# Patient Record
Sex: Female | Born: 1965
Health system: Southern US, Community
[De-identification: ages and names within clinical notes are randomized; demographics above are authoritative.]

---

## 1998-03-20 ENCOUNTER — Other Ambulatory Visit: Admission: RE | Admit: 1998-03-20 | Discharge: 1998-03-20 | Payer: Self-pay | Admitting: Obstetrics & Gynecology

## 1999-04-01 ENCOUNTER — Other Ambulatory Visit: Admission: RE | Admit: 1999-04-01 | Discharge: 1999-04-01 | Payer: Self-pay | Admitting: Obstetrics & Gynecology

## 2000-05-10 ENCOUNTER — Other Ambulatory Visit: Admission: RE | Admit: 2000-05-10 | Discharge: 2000-05-10 | Payer: Self-pay | Admitting: Obstetrics & Gynecology

## 2001-05-27 ENCOUNTER — Other Ambulatory Visit: Admission: RE | Admit: 2001-05-27 | Discharge: 2001-05-27 | Payer: Self-pay | Admitting: Obstetrics & Gynecology

## 2001-06-13 ENCOUNTER — Ambulatory Visit (HOSPITAL_BASED_OUTPATIENT_CLINIC_OR_DEPARTMENT_OTHER): Admission: RE | Admit: 2001-06-13 | Discharge: 2001-06-13 | Payer: Self-pay | Admitting: General Surgery

## 2001-06-13 ENCOUNTER — Encounter (INDEPENDENT_AMBULATORY_CARE_PROVIDER_SITE_OTHER): Payer: Self-pay | Admitting: Specialist

## 2001-11-02 ENCOUNTER — Encounter: Admission: RE | Admit: 2001-11-02 | Discharge: 2002-01-31 | Payer: Self-pay | Admitting: Internal Medicine

## 2002-07-04 ENCOUNTER — Other Ambulatory Visit: Admission: RE | Admit: 2002-07-04 | Discharge: 2002-07-04 | Payer: Self-pay | Admitting: Obstetrics & Gynecology

## 2003-08-01 ENCOUNTER — Other Ambulatory Visit: Admission: RE | Admit: 2003-08-01 | Discharge: 2003-08-01 | Payer: Self-pay | Admitting: Obstetrics & Gynecology

## 2004-09-16 ENCOUNTER — Other Ambulatory Visit: Admission: RE | Admit: 2004-09-16 | Discharge: 2004-09-16 | Payer: Self-pay | Admitting: Obstetrics & Gynecology

## 2006-11-03 ENCOUNTER — Encounter: Admission: RE | Admit: 2006-11-03 | Discharge: 2006-11-03 | Payer: Self-pay | Admitting: Obstetrics & Gynecology

## 2010-10-19 ENCOUNTER — Emergency Department (HOSPITAL_COMMUNITY): Payer: BC Managed Care – PPO

## 2010-10-19 ENCOUNTER — Emergency Department (HOSPITAL_COMMUNITY)
Admission: EM | Admit: 2010-10-19 | Discharge: 2010-10-20 | Disposition: A | Discharge status: Home / Self Care | Payer: BC Managed Care – PPO | Attending: Emergency Medicine | Admitting: Emergency Medicine

## 2010-10-19 DIAGNOSIS — W1809XA Striking against other object with subsequent fall, initial encounter: Secondary | ICD-10-CM | POA: Insufficient documentation

## 2010-10-19 DIAGNOSIS — M25579 Pain in unspecified ankle and joints of unspecified foot: Secondary | ICD-10-CM | POA: Insufficient documentation

## 2010-10-19 DIAGNOSIS — S93409A Sprain of unspecified ligament of unspecified ankle, initial encounter: Secondary | ICD-10-CM | POA: Insufficient documentation

## 2010-10-19 DIAGNOSIS — Y92009 Unspecified place in unspecified non-institutional (private) residence as the place of occurrence of the external cause: Secondary | ICD-10-CM | POA: Insufficient documentation

## 2010-10-19 DIAGNOSIS — S82899A Other fracture of unspecified lower leg, initial encounter for closed fracture: Secondary | ICD-10-CM | POA: Insufficient documentation

## 2010-10-19 DIAGNOSIS — E7800 Pure hypercholesterolemia, unspecified: Secondary | ICD-10-CM | POA: Insufficient documentation

## 2013-12-29 ENCOUNTER — Other Ambulatory Visit: Payer: Self-pay | Admitting: Obstetrics & Gynecology

## 2013-12-29 DIAGNOSIS — R928 Other abnormal and inconclusive findings on diagnostic imaging of breast: Secondary | ICD-10-CM

## 2014-01-12 ENCOUNTER — Encounter (INDEPENDENT_AMBULATORY_CARE_PROVIDER_SITE_OTHER): Payer: Self-pay

## 2014-01-12 ENCOUNTER — Ambulatory Visit
Admission: RE | Admit: 2014-01-12 | Discharge: 2014-01-12 | Disposition: A | Discharge status: Home / Self Care | Payer: BC Managed Care – PPO | Source: Ambulatory Visit | Attending: Obstetrics & Gynecology | Admitting: Obstetrics & Gynecology

## 2014-01-12 DIAGNOSIS — R928 Other abnormal and inconclusive findings on diagnostic imaging of breast: Secondary | ICD-10-CM

## 2014-06-22 ENCOUNTER — Other Ambulatory Visit: Payer: Self-pay | Admitting: Obstetrics & Gynecology

## 2014-06-22 DIAGNOSIS — R921 Mammographic calcification found on diagnostic imaging of breast: Secondary | ICD-10-CM

## 2014-07-03 ENCOUNTER — Inpatient Hospital Stay: Admission: RE | Admit: 2014-07-03 | Payer: Self-pay | Source: Ambulatory Visit

## 2014-07-05 ENCOUNTER — Ambulatory Visit
Admission: RE | Admit: 2014-07-05 | Discharge: 2014-07-05 | Disposition: A | Discharge status: Home / Self Care | Payer: 59 | Source: Ambulatory Visit | Attending: Obstetrics & Gynecology | Admitting: Obstetrics & Gynecology

## 2014-07-05 DIAGNOSIS — R921 Mammographic calcification found on diagnostic imaging of breast: Secondary | ICD-10-CM

## 2015-03-01 ENCOUNTER — Other Ambulatory Visit: Payer: Self-pay | Admitting: Obstetrics & Gynecology

## 2015-03-01 DIAGNOSIS — R921 Mammographic calcification found on diagnostic imaging of breast: Secondary | ICD-10-CM

## 2015-03-11 ENCOUNTER — Ambulatory Visit
Admission: RE | Admit: 2015-03-11 | Discharge: 2015-03-11 | Disposition: A | Discharge status: Home / Self Care | Payer: 59 | Source: Ambulatory Visit | Attending: Obstetrics & Gynecology | Admitting: Obstetrics & Gynecology

## 2015-03-11 DIAGNOSIS — R921 Mammographic calcification found on diagnostic imaging of breast: Secondary | ICD-10-CM

## 2015-11-07 DIAGNOSIS — J209 Acute bronchitis, unspecified: Secondary | ICD-10-CM | POA: Diagnosis not present

## 2015-11-07 DIAGNOSIS — E669 Obesity, unspecified: Secondary | ICD-10-CM | POA: Diagnosis not present

## 2015-11-07 DIAGNOSIS — Z6834 Body mass index (BMI) 34.0-34.9, adult: Secondary | ICD-10-CM | POA: Diagnosis not present

## 2015-11-12 DIAGNOSIS — Z6835 Body mass index (BMI) 35.0-35.9, adult: Secondary | ICD-10-CM | POA: Diagnosis not present

## 2015-11-12 DIAGNOSIS — H938X9 Other specified disorders of ear, unspecified ear: Secondary | ICD-10-CM | POA: Diagnosis not present

## 2015-11-25 DIAGNOSIS — R05 Cough: Secondary | ICD-10-CM | POA: Diagnosis not present

## 2015-11-25 DIAGNOSIS — Z6834 Body mass index (BMI) 34.0-34.9, adult: Secondary | ICD-10-CM | POA: Diagnosis not present

## 2015-11-25 DIAGNOSIS — J019 Acute sinusitis, unspecified: Secondary | ICD-10-CM | POA: Diagnosis not present

## 2015-11-25 DIAGNOSIS — H938X2 Other specified disorders of left ear: Secondary | ICD-10-CM | POA: Diagnosis not present

## 2016-03-03 ENCOUNTER — Other Ambulatory Visit: Payer: Self-pay | Admitting: Obstetrics & Gynecology

## 2016-03-03 DIAGNOSIS — R921 Mammographic calcification found on diagnostic imaging of breast: Secondary | ICD-10-CM

## 2016-03-04 DIAGNOSIS — R05 Cough: Secondary | ICD-10-CM | POA: Diagnosis not present

## 2016-03-04 DIAGNOSIS — Z6835 Body mass index (BMI) 35.0-35.9, adult: Secondary | ICD-10-CM | POA: Diagnosis not present

## 2016-03-04 DIAGNOSIS — G4719 Other hypersomnia: Secondary | ICD-10-CM | POA: Diagnosis not present

## 2016-03-04 DIAGNOSIS — R0683 Snoring: Secondary | ICD-10-CM | POA: Diagnosis not present

## 2016-03-11 DIAGNOSIS — Z6836 Body mass index (BMI) 36.0-36.9, adult: Secondary | ICD-10-CM | POA: Diagnosis not present

## 2016-03-11 DIAGNOSIS — Z01419 Encounter for gynecological examination (general) (routine) without abnormal findings: Secondary | ICD-10-CM | POA: Diagnosis not present

## 2016-03-11 DIAGNOSIS — N924 Excessive bleeding in the premenopausal period: Secondary | ICD-10-CM | POA: Diagnosis not present

## 2016-03-12 ENCOUNTER — Ambulatory Visit
Admission: RE | Admit: 2016-03-12 | Discharge: 2016-03-12 | Disposition: A | Discharge status: Home / Self Care | Payer: BLUE CROSS/BLUE SHIELD | Source: Ambulatory Visit | Attending: Obstetrics & Gynecology | Admitting: Obstetrics & Gynecology

## 2016-03-12 DIAGNOSIS — R921 Mammographic calcification found on diagnostic imaging of breast: Secondary | ICD-10-CM | POA: Diagnosis not present

## 2016-03-31 DIAGNOSIS — E7800 Pure hypercholesterolemia, unspecified: Secondary | ICD-10-CM | POA: Diagnosis not present

## 2016-03-31 DIAGNOSIS — E669 Obesity, unspecified: Secondary | ICD-10-CM | POA: Diagnosis not present

## 2016-03-31 DIAGNOSIS — Z6835 Body mass index (BMI) 35.0-35.9, adult: Secondary | ICD-10-CM | POA: Diagnosis not present

## 2016-03-31 DIAGNOSIS — Z0000 Encounter for general adult medical examination without abnormal findings: Secondary | ICD-10-CM | POA: Diagnosis not present

## 2016-06-26 DIAGNOSIS — L821 Other seborrheic keratosis: Secondary | ICD-10-CM | POA: Diagnosis not present

## 2016-06-26 DIAGNOSIS — L820 Inflamed seborrheic keratosis: Secondary | ICD-10-CM | POA: Diagnosis not present

## 2016-07-06 DIAGNOSIS — G4733 Obstructive sleep apnea (adult) (pediatric): Secondary | ICD-10-CM | POA: Diagnosis not present

## 2016-08-03 DIAGNOSIS — G4733 Obstructive sleep apnea (adult) (pediatric): Secondary | ICD-10-CM | POA: Diagnosis not present

## 2016-08-04 DIAGNOSIS — G4733 Obstructive sleep apnea (adult) (pediatric): Secondary | ICD-10-CM | POA: Diagnosis not present

## 2016-09-03 DIAGNOSIS — G4733 Obstructive sleep apnea (adult) (pediatric): Secondary | ICD-10-CM | POA: Diagnosis not present

## 2016-09-09 DIAGNOSIS — R0789 Other chest pain: Secondary | ICD-10-CM | POA: Diagnosis not present

## 2016-09-09 DIAGNOSIS — Z6832 Body mass index (BMI) 32.0-32.9, adult: Secondary | ICD-10-CM | POA: Diagnosis not present

## 2016-09-09 DIAGNOSIS — E669 Obesity, unspecified: Secondary | ICD-10-CM | POA: Diagnosis not present

## 2016-10-03 DIAGNOSIS — G4733 Obstructive sleep apnea (adult) (pediatric): Secondary | ICD-10-CM | POA: Diagnosis not present

## 2016-10-05 DIAGNOSIS — G4733 Obstructive sleep apnea (adult) (pediatric): Secondary | ICD-10-CM | POA: Diagnosis not present

## 2016-11-03 DIAGNOSIS — G4733 Obstructive sleep apnea (adult) (pediatric): Secondary | ICD-10-CM | POA: Diagnosis not present

## 2016-11-05 DIAGNOSIS — D259 Leiomyoma of uterus, unspecified: Secondary | ICD-10-CM | POA: Diagnosis not present

## 2016-11-05 DIAGNOSIS — N924 Excessive bleeding in the premenopausal period: Secondary | ICD-10-CM | POA: Diagnosis not present

## 2016-12-03 DIAGNOSIS — G4733 Obstructive sleep apnea (adult) (pediatric): Secondary | ICD-10-CM | POA: Diagnosis not present

## 2017-05-24 DIAGNOSIS — Z6827 Body mass index (BMI) 27.0-27.9, adult: Secondary | ICD-10-CM | POA: Diagnosis not present

## 2017-05-24 DIAGNOSIS — Z01419 Encounter for gynecological examination (general) (routine) without abnormal findings: Secondary | ICD-10-CM | POA: Diagnosis not present

## 2017-05-24 DIAGNOSIS — Z1231 Encounter for screening mammogram for malignant neoplasm of breast: Secondary | ICD-10-CM | POA: Diagnosis not present

## 2017-05-31 ENCOUNTER — Other Ambulatory Visit: Payer: Self-pay | Admitting: Obstetrics & Gynecology

## 2017-05-31 DIAGNOSIS — R928 Other abnormal and inconclusive findings on diagnostic imaging of breast: Secondary | ICD-10-CM

## 2017-06-03 ENCOUNTER — Ambulatory Visit: Payer: BLUE CROSS/BLUE SHIELD

## 2017-06-03 ENCOUNTER — Ambulatory Visit
Admission: RE | Admit: 2017-06-03 | Discharge: 2017-06-03 | Disposition: A | Discharge status: Home / Self Care | Payer: BLUE CROSS/BLUE SHIELD | Source: Ambulatory Visit | Attending: Obstetrics & Gynecology | Admitting: Obstetrics & Gynecology

## 2017-06-03 DIAGNOSIS — R928 Other abnormal and inconclusive findings on diagnostic imaging of breast: Secondary | ICD-10-CM | POA: Diagnosis not present

## 2017-06-14 DIAGNOSIS — Z780 Asymptomatic menopausal state: Secondary | ICD-10-CM | POA: Diagnosis not present

## 2017-08-12 DIAGNOSIS — J358 Other chronic diseases of tonsils and adenoids: Secondary | ICD-10-CM | POA: Diagnosis not present

## 2017-08-12 DIAGNOSIS — E7800 Pure hypercholesterolemia, unspecified: Secondary | ICD-10-CM | POA: Diagnosis not present

## 2017-08-12 DIAGNOSIS — Z6827 Body mass index (BMI) 27.0-27.9, adult: Secondary | ICD-10-CM | POA: Diagnosis not present

## 2018-06-07 DIAGNOSIS — Z6839 Body mass index (BMI) 39.0-39.9, adult: Secondary | ICD-10-CM | POA: Diagnosis not present

## 2018-06-07 DIAGNOSIS — Z1231 Encounter for screening mammogram for malignant neoplasm of breast: Secondary | ICD-10-CM | POA: Diagnosis not present

## 2018-06-07 DIAGNOSIS — Z01419 Encounter for gynecological examination (general) (routine) without abnormal findings: Secondary | ICD-10-CM | POA: Diagnosis not present

## 2018-07-26 DIAGNOSIS — Z1211 Encounter for screening for malignant neoplasm of colon: Secondary | ICD-10-CM | POA: Diagnosis not present

## 2018-07-26 DIAGNOSIS — E669 Obesity, unspecified: Secondary | ICD-10-CM | POA: Diagnosis not present

## 2018-07-26 DIAGNOSIS — R740 Nonspecific elevation of levels of transaminase and lactic acid dehydrogenase [LDH]: Secondary | ICD-10-CM | POA: Diagnosis not present

## 2018-08-03 DIAGNOSIS — K621 Rectal polyp: Secondary | ICD-10-CM | POA: Diagnosis not present

## 2018-08-03 DIAGNOSIS — D127 Benign neoplasm of rectosigmoid junction: Secondary | ICD-10-CM | POA: Diagnosis not present

## 2018-08-03 DIAGNOSIS — D122 Benign neoplasm of ascending colon: Secondary | ICD-10-CM | POA: Diagnosis not present

## 2018-08-03 DIAGNOSIS — K635 Polyp of colon: Secondary | ICD-10-CM | POA: Diagnosis not present

## 2018-08-03 DIAGNOSIS — K6389 Other specified diseases of intestine: Secondary | ICD-10-CM | POA: Diagnosis not present

## 2018-08-03 DIAGNOSIS — Z1211 Encounter for screening for malignant neoplasm of colon: Secondary | ICD-10-CM | POA: Diagnosis not present

## 2018-08-03 DIAGNOSIS — K5730 Diverticulosis of large intestine without perforation or abscess without bleeding: Secondary | ICD-10-CM | POA: Diagnosis not present

## 2018-10-20 DIAGNOSIS — F410 Panic disorder [episodic paroxysmal anxiety] without agoraphobia: Secondary | ICD-10-CM | POA: Diagnosis not present

## 2018-10-20 DIAGNOSIS — E7800 Pure hypercholesterolemia, unspecified: Secondary | ICD-10-CM | POA: Diagnosis not present

## 2018-10-20 DIAGNOSIS — Z6832 Body mass index (BMI) 32.0-32.9, adult: Secondary | ICD-10-CM | POA: Diagnosis not present

## 2018-10-20 DIAGNOSIS — Z1331 Encounter for screening for depression: Secondary | ICD-10-CM | POA: Diagnosis not present

## 2019-03-16 IMAGING — MG 2D DIGITAL DIAGNOSTIC UNILATERAL LEFT MAMMOGRAM WITH CAD AND ADJ
6 series · 6 of 14 positions shown · non-contrast
Comparison: Previous exam(s).

CLINICAL DATA: Screening recall for a possible asymmetry in the
left breast seen on the CC view.

EXAM:
2D DIGITAL DIAGNOSTIC UNILATERAL LEFT MAMMOGRAM WITH CAD AND ADJUNCT
TOMO

[L CC]
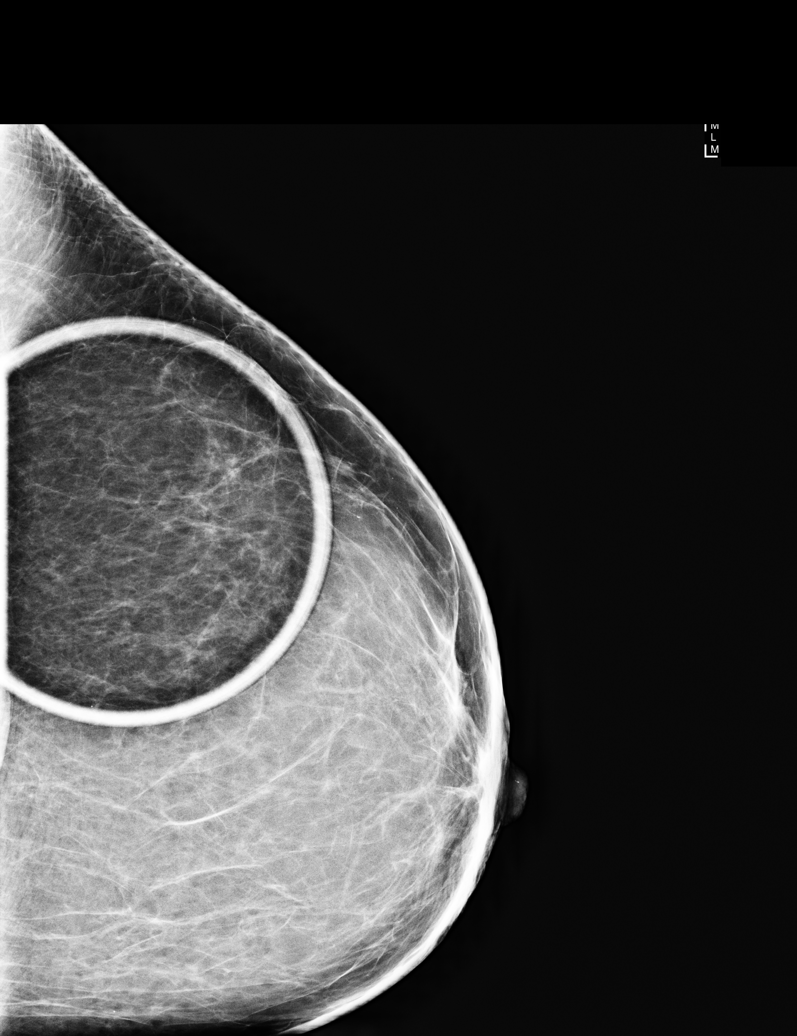

[L CC synth-2D]
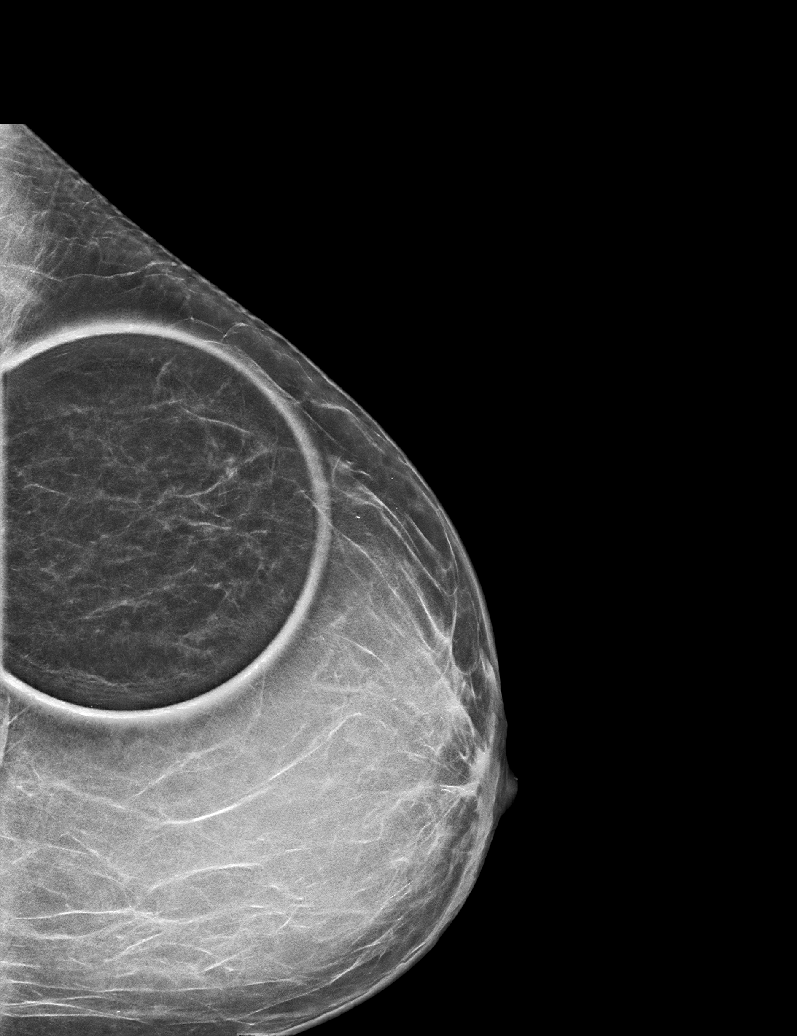

[L ML synth-2D]
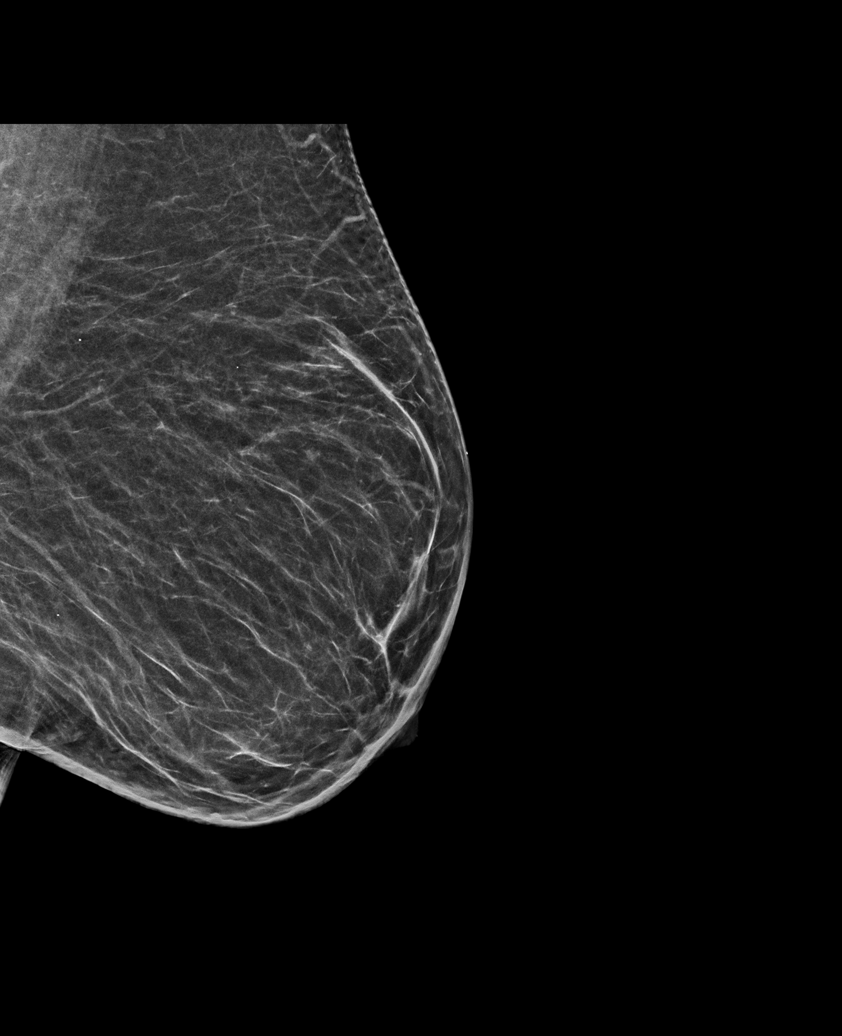

[L ML]
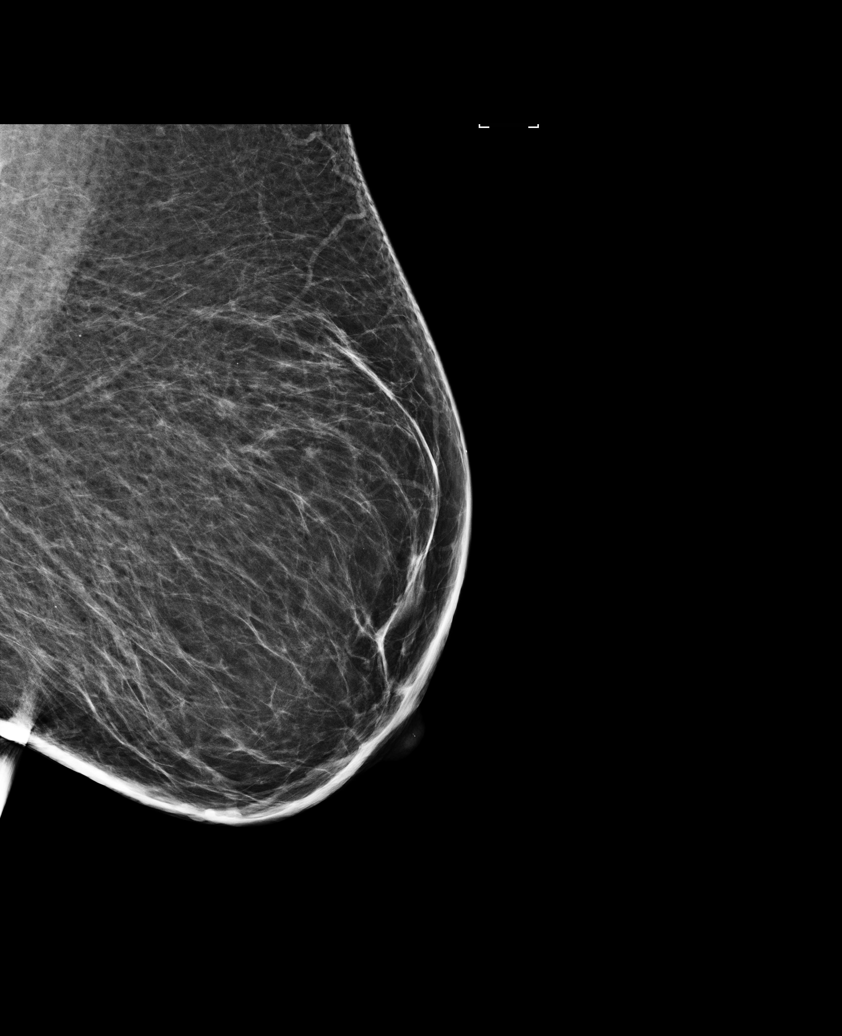

[L CC tomo · tomo slice 27/52.0]
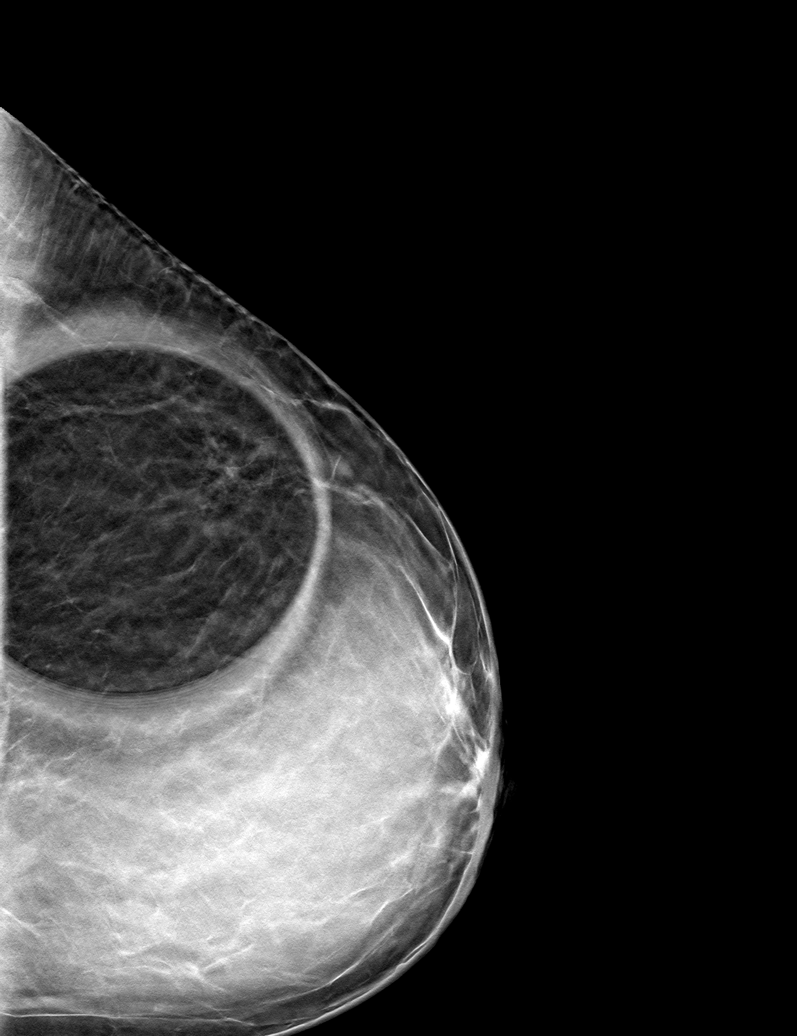

[L ML tomo · tomo slice 29/57.0]
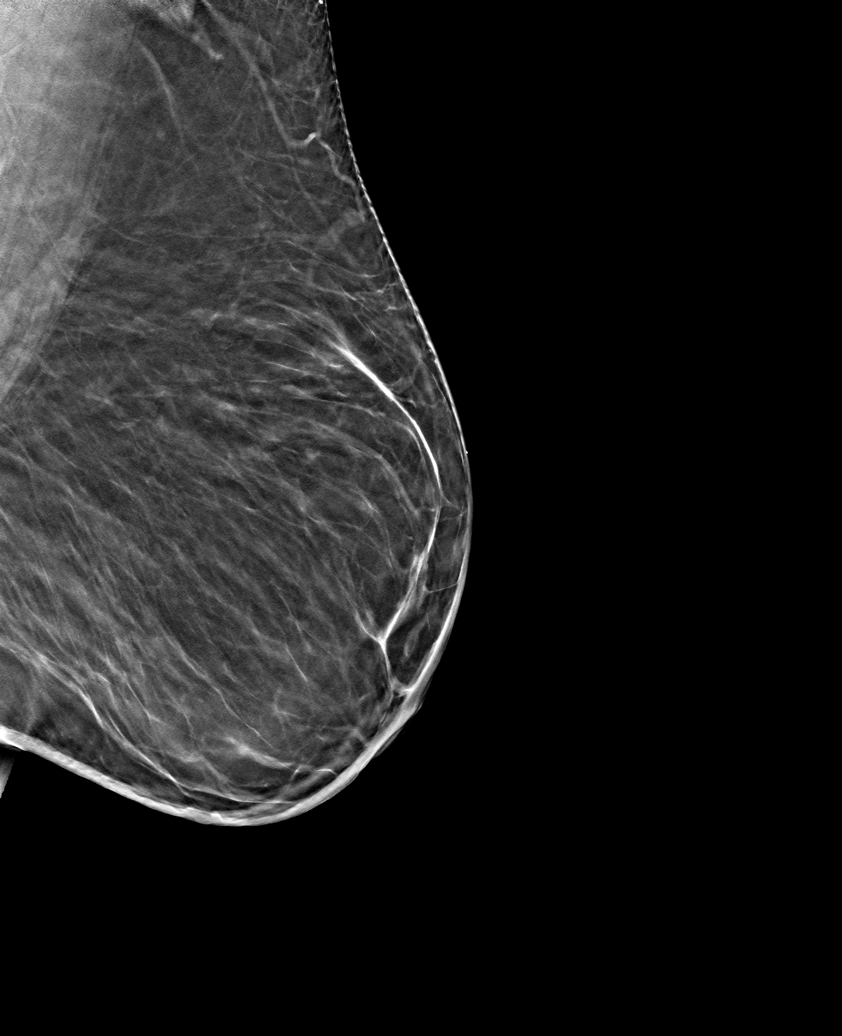

[6 of 14 positions shown; findings below may reference images not displayed]

ACR Breast Density Category b: There are scattered areas of
fibroglandular density.
FINDINGS: On the diagnostic spot-compression 2D and 3D images, the possible
asymmetry disperses consistent with superimposed fibroglandular
tissue. There is no residual asymmetry. No abnormality is seen on
the mL views. There are no discrete masses and there are no areas of
architectural distortion.

Mammographic images were processed with CAD.
IMPRESSION: Negative exam.  No evidence of breast malignancy.

RECOMMENDATION:
Screening mammogram in one year.(Code:KX-4-8XR)

I have discussed the findings and recommendations with the patient.
Results were also provided in writing at the conclusion of the
visit. If applicable, a reminder letter will be sent to the patient
regarding the next appointment.

BI-RADS CATEGORY  1: Negative.

## 2019-09-14 ENCOUNTER — Encounter (INDEPENDENT_AMBULATORY_CARE_PROVIDER_SITE_OTHER): Payer: Self-pay | Admitting: Otolaryngology

## 2019-09-14 ENCOUNTER — Ambulatory Visit (INDEPENDENT_AMBULATORY_CARE_PROVIDER_SITE_OTHER): Payer: Self-pay | Admitting: Otolaryngology

## 2019-09-14 ENCOUNTER — Other Ambulatory Visit: Payer: Self-pay

## 2019-09-14 VITALS — Temp 98.1°F

## 2019-09-14 DIAGNOSIS — J310 Chronic rhinitis: Secondary | ICD-10-CM

## 2019-09-14 DIAGNOSIS — J358 Other chronic diseases of tonsils and adenoids: Secondary | ICD-10-CM

## 2019-09-14 NOTE — Progress Notes (Signed)
HPI: Maria Baker is a 54 y.o. female who presents for evaluation of tonsil stones.  She has just recently been developing tonsil stones mostly on the left side.  She is able to extrude these with popsicle sticks but more recently has had some slight discomfort in the lower left tonsil and wanted this checked. She used to smoke but does not smoke presently. She has a family history of cancer.  No past medical history on file.  Social History   Socioeconomic History  . Marital status: Married    Spouse name: Not on file  . Number of children: Not on file  . Years of education: Not on file  . Highest education level: Not on file  Occupational History  . Not on file  Tobacco Use  . Smoking status: Former Smoker    Packs/day: 0.75    Years: 15.00    Pack years: 11.25    Start date: 1984    Quit date: 1999    Years since quitting: 22.3  . Smokeless tobacco: Never Used  Substance and Sexual Activity  . Alcohol use: Not on file  . Drug use: Not on file  . Sexual activity: Not on file  Other Topics Concern  . Not on file  Social History Narrative  . Not on file   Social Determinants of Health   Financial Resource Strain:   . Difficulty of Paying Living Expenses:   Food Insecurity:   . Worried About Charity fundraiser in the Last Year:   . Arboriculturist in the Last Year:   Transportation Needs:   . Film/video editor (Medical):   Marland Kitchen Lack of Transportation (Non-Medical):   Physical Activity:   . Days of Exercise per Week:   . Minutes of Exercise per Session:   Stress:   . Feeling of Stress :   Social Connections:   . Frequency of Communication with Friends and Family:   . Frequency of Social Gatherings with Friends and Family:   . Attends Religious Services:   . Active Member of Clubs or Organizations:   . Attends Archivist Meetings:   Marland Kitchen Marital Status:    No family history on file. Allergies  Allergen Reactions  . Sulfa Antibiotics    Prior to  Admission medications   Not on File     Positive ROS: Otherwise negative  All other systems have been reviewed and were otherwise negative with the exception of those mentioned in the HPI and as above.  Physical Exam: Constitutional: Alert, well-appearing, no acute distress Ears: External ears without lesions or tenderness. Ear canals are clear bilaterally with intact, clear TMs bilaterally. Nasal: External nose without lesions. Septum with minimal deformity and moderate rhinitis.  No polyps noted.. Clear nasal passages Oral: Lips and gums without lesions. Tongue and palate mucosa without lesions. Posterior oropharynx clear.  Tonsils are benign in appearance bilaterally I cannot identify any obvious tonsil stones on the left tonsil.  On palpation of the tonsils no abnormality noted on palpation of left tonsil.  Indirect laryngoscopy revealed a clear base of tongue vallecula and epiglottis.  Vocal cords were clear bilaterally with normal vocal mobility. Neck: No palpable adenopathy or masses Respiratory: Breathing comfortably  Skin: No facial/neck lesions or rash noted.  Procedures  Assessment: Chronic rhinitis History of tonsilloliths  Plan: Reviewed with her concerning normal upper airway examination with no evidence of neoplasm or infection. For the postnasal drainage recommend use of saline nasal  irrigation. Sometimes she gets mucus stuck in her throat and if this is a chronic problem recommended use of Prilosec before dinner on a regular basis for 3 to 4 weeks. She will follow-up as needed  Narda Bonds, MD

## 2020-05-03 ENCOUNTER — Ambulatory Visit: Payer: Self-pay | Admitting: Podiatry

## 2020-09-04 ENCOUNTER — Ambulatory Visit (INDEPENDENT_AMBULATORY_CARE_PROVIDER_SITE_OTHER): Payer: Self-pay

## 2020-09-04 ENCOUNTER — Encounter: Payer: Self-pay | Admitting: Podiatry

## 2020-09-04 ENCOUNTER — Ambulatory Visit (INDEPENDENT_AMBULATORY_CARE_PROVIDER_SITE_OTHER): Payer: Self-pay | Admitting: Podiatry

## 2020-09-04 ENCOUNTER — Other Ambulatory Visit: Payer: Self-pay

## 2020-09-04 DIAGNOSIS — M722 Plantar fascial fibromatosis: Secondary | ICD-10-CM

## 2020-09-04 MED ORDER — DICLOFENAC SODIUM 75 MG PO TBEC: 75.0000 mg | DELAYED_RELEASE_TABLET | Freq: Two times a day (BID) | ORAL | 2 refills | Status: AC

## 2020-09-04 MED ORDER — TRIAMCINOLONE ACETONIDE 10 MG/ML IJ SUSP: 10.0000 mg | Freq: Once | INTRAMUSCULAR | Status: AC

## 2020-09-04 NOTE — Progress Notes (Signed)
Subjective:   Patient ID: Maria Baker, female   DOB: 55 y.o.   MRN: [id number]   HPI Patient states she is developed a lot of pain in the bottom of the left heel and that it has gradually become more intense.  States that she is tried ibuprofen and different treatments and has not been successful in its been present for around 6 months and patient does not smoke likes to be active   Review of Systems  All other systems reviewed and are negative.       Objective:  Physical Exam Vitals and nursing note reviewed.  Constitutional:      Appearance: She is well-developed.  Pulmonary:     Effort: Pulmonary effort is normal.  Musculoskeletal:        General: Normal range of motion.  Skin:    General: Skin is warm.  Neurological:     Mental Status: She is alert.     Neurovascular status intact muscle strength adequate range of motion within normal limits.  Patient is found to have exquisite discomfort plantar aspect left heel at the insertional point of the tendon into the calcaneus with inflammation fluid around the medial band.  Patient found to have good digital perfusion well oriented x3 and has moderate depression of the arch     Assessment:  Acute plantar fasciitis left inflammation fluid buildup medial band     Plan:  H&P reviewed condition x-ray and today I went ahead did sterile prep injected the fascia 3 mg Kenalog 5 mg Xylocaine applied fascial brace to lift the arch gave instructions for physical therapy and shoe gear modification.  Placed on diclofenac 75 mg twice daily reappoint to recheck  X-rays indicate spur no indications of stress fracture arthritis

## 2020-09-04 NOTE — Patient Instructions (Signed)

## 2020-09-18 ENCOUNTER — Encounter: Payer: Self-pay | Admitting: Podiatry

## 2020-09-18 ENCOUNTER — Other Ambulatory Visit: Payer: Self-pay

## 2020-09-18 ENCOUNTER — Ambulatory Visit (INDEPENDENT_AMBULATORY_CARE_PROVIDER_SITE_OTHER): Payer: Self-pay | Admitting: Podiatry

## 2020-09-18 DIAGNOSIS — M722 Plantar fascial fibromatosis: Secondary | ICD-10-CM

## 2020-09-20 NOTE — Progress Notes (Signed)
Subjective:   Patient ID: Maria Baker, female   DOB: 55 y.o.   MRN: [id number]   HPI Patient presents stating that her left heel is doing much better with only minimal discomfort and she is very pleased with her response at this time   ROS      Objective:  Physical Exam  Neurovascular status intact with significant diminishment of discomfort left plantar fascia at the insertional point tendon calcaneus with inflammation fluid around the medial band     Assessment:  Acute Planter fasciitis left improved     Plan:  Reviewed condition recommended the continuation of support therapy finishing oral anti-inflammatories and topical as needed and patient will be seen back if symptoms indicate

## 2021-07-28 ENCOUNTER — Other Ambulatory Visit: Payer: Self-pay | Admitting: Obstetrics & Gynecology

## 2021-07-28 DIAGNOSIS — R928 Other abnormal and inconclusive findings on diagnostic imaging of breast: Secondary | ICD-10-CM

## 2021-08-11 ENCOUNTER — Ambulatory Visit: Payer: Self-pay

## 2021-08-11 ENCOUNTER — Ambulatory Visit
Admission: RE | Admit: 2021-08-11 | Discharge: 2021-08-11 | Disposition: A | Discharge status: Home / Self Care | Payer: No Typology Code available for payment source | Source: Ambulatory Visit | Attending: Obstetrics & Gynecology | Admitting: Obstetrics & Gynecology

## 2021-08-11 DIAGNOSIS — R928 Other abnormal and inconclusive findings on diagnostic imaging of breast: Secondary | ICD-10-CM

## 2021-08-18 ENCOUNTER — Other Ambulatory Visit: Payer: Self-pay

## 2022-05-19 DIAGNOSIS — J09X2 Influenza due to identified novel influenza A virus with other respiratory manifestations: Secondary | ICD-10-CM | POA: Diagnosis not present

## 2022-05-19 DIAGNOSIS — R051 Acute cough: Secondary | ICD-10-CM | POA: Diagnosis not present

## 2022-06-15 DIAGNOSIS — L821 Other seborrheic keratosis: Secondary | ICD-10-CM | POA: Diagnosis not present

## 2022-06-15 DIAGNOSIS — L814 Other melanin hyperpigmentation: Secondary | ICD-10-CM | POA: Diagnosis not present

## 2022-06-15 DIAGNOSIS — D225 Melanocytic nevi of trunk: Secondary | ICD-10-CM | POA: Diagnosis not present

## 2022-06-22 DIAGNOSIS — H10013 Acute follicular conjunctivitis, bilateral: Secondary | ICD-10-CM | POA: Diagnosis not present

## 2022-06-22 DIAGNOSIS — J069 Acute upper respiratory infection, unspecified: Secondary | ICD-10-CM | POA: Diagnosis not present

## 2022-06-22 DIAGNOSIS — Z20828 Contact with and (suspected) exposure to other viral communicable diseases: Secondary | ICD-10-CM | POA: Diagnosis not present

## 2022-06-26 DIAGNOSIS — J019 Acute sinusitis, unspecified: Secondary | ICD-10-CM | POA: Diagnosis not present

## 2022-07-24 DIAGNOSIS — M25521 Pain in right elbow: Secondary | ICD-10-CM | POA: Diagnosis not present

## 2022-10-06 DIAGNOSIS — Z1231 Encounter for screening mammogram for malignant neoplasm of breast: Secondary | ICD-10-CM | POA: Diagnosis not present

## 2022-10-06 DIAGNOSIS — Z01419 Encounter for gynecological examination (general) (routine) without abnormal findings: Secondary | ICD-10-CM | POA: Diagnosis not present

## 2022-10-06 DIAGNOSIS — Z6836 Body mass index (BMI) 36.0-36.9, adult: Secondary | ICD-10-CM | POA: Diagnosis not present

## 2022-12-22 DIAGNOSIS — E669 Obesity, unspecified: Secondary | ICD-10-CM | POA: Diagnosis not present

## 2022-12-22 DIAGNOSIS — F410 Panic disorder [episodic paroxysmal anxiety] without agoraphobia: Secondary | ICD-10-CM | POA: Diagnosis not present

## 2022-12-22 DIAGNOSIS — E7800 Pure hypercholesterolemia, unspecified: Secondary | ICD-10-CM | POA: Diagnosis not present

## 2022-12-22 DIAGNOSIS — Z79899 Other long term (current) drug therapy: Secondary | ICD-10-CM | POA: Diagnosis not present

## 2023-03-24 DIAGNOSIS — L821 Other seborrheic keratosis: Secondary | ICD-10-CM | POA: Diagnosis not present

## 2023-03-24 DIAGNOSIS — C44519 Basal cell carcinoma of skin of other part of trunk: Secondary | ICD-10-CM | POA: Diagnosis not present

## 2023-03-24 DIAGNOSIS — D485 Neoplasm of uncertain behavior of skin: Secondary | ICD-10-CM | POA: Diagnosis not present

## 2023-03-24 DIAGNOSIS — L578 Other skin changes due to chronic exposure to nonionizing radiation: Secondary | ICD-10-CM | POA: Diagnosis not present

## 2023-03-24 DIAGNOSIS — Z85828 Personal history of other malignant neoplasm of skin: Secondary | ICD-10-CM | POA: Diagnosis not present

## 2023-05-06 DIAGNOSIS — L820 Inflamed seborrheic keratosis: Secondary | ICD-10-CM | POA: Diagnosis not present

## 2023-05-06 DIAGNOSIS — C44519 Basal cell carcinoma of skin of other part of trunk: Secondary | ICD-10-CM | POA: Diagnosis not present

## 2023-08-10 DIAGNOSIS — E782 Mixed hyperlipidemia: Secondary | ICD-10-CM | POA: Diagnosis not present

## 2023-08-10 DIAGNOSIS — Z860100 Personal history of colon polyps, unspecified: Secondary | ICD-10-CM | POA: Diagnosis not present

## 2023-08-10 DIAGNOSIS — Z1211 Encounter for screening for malignant neoplasm of colon: Secondary | ICD-10-CM | POA: Diagnosis not present

## 2023-10-01 DIAGNOSIS — I10 Essential (primary) hypertension: Secondary | ICD-10-CM | POA: Diagnosis not present

## 2023-10-05 DIAGNOSIS — G4733 Obstructive sleep apnea (adult) (pediatric): Secondary | ICD-10-CM | POA: Diagnosis not present

## 2023-10-05 DIAGNOSIS — I10 Essential (primary) hypertension: Secondary | ICD-10-CM | POA: Diagnosis not present

## 2023-10-05 DIAGNOSIS — F410 Panic disorder [episodic paroxysmal anxiety] without agoraphobia: Secondary | ICD-10-CM | POA: Diagnosis not present

## 2023-10-05 DIAGNOSIS — E782 Mixed hyperlipidemia: Secondary | ICD-10-CM | POA: Diagnosis not present

## 2023-10-12 DIAGNOSIS — Z6838 Body mass index (BMI) 38.0-38.9, adult: Secondary | ICD-10-CM | POA: Diagnosis not present

## 2023-10-12 DIAGNOSIS — Z1231 Encounter for screening mammogram for malignant neoplasm of breast: Secondary | ICD-10-CM | POA: Diagnosis not present

## 2023-10-12 DIAGNOSIS — Z01419 Encounter for gynecological examination (general) (routine) without abnormal findings: Secondary | ICD-10-CM | POA: Diagnosis not present

## 2023-12-23 DIAGNOSIS — I10 Essential (primary) hypertension: Secondary | ICD-10-CM | POA: Diagnosis not present

## 2023-12-23 DIAGNOSIS — F410 Panic disorder [episodic paroxysmal anxiety] without agoraphobia: Secondary | ICD-10-CM | POA: Diagnosis not present

## 2023-12-23 DIAGNOSIS — Z79899 Other long term (current) drug therapy: Secondary | ICD-10-CM | POA: Diagnosis not present

## 2023-12-23 DIAGNOSIS — E7800 Pure hypercholesterolemia, unspecified: Secondary | ICD-10-CM | POA: Diagnosis not present

## 2023-12-23 DIAGNOSIS — Z1331 Encounter for screening for depression: Secondary | ICD-10-CM | POA: Diagnosis not present

## 2023-12-23 DIAGNOSIS — E669 Obesity, unspecified: Secondary | ICD-10-CM | POA: Diagnosis not present

## 2024-01-04 DIAGNOSIS — E782 Mixed hyperlipidemia: Secondary | ICD-10-CM | POA: Diagnosis not present

## 2024-01-04 DIAGNOSIS — I10 Essential (primary) hypertension: Secondary | ICD-10-CM | POA: Diagnosis not present

## 2024-01-04 DIAGNOSIS — E669 Obesity, unspecified: Secondary | ICD-10-CM | POA: Diagnosis not present

## 2024-01-04 DIAGNOSIS — G4733 Obstructive sleep apnea (adult) (pediatric): Secondary | ICD-10-CM | POA: Diagnosis not present

## 2024-01-14 DIAGNOSIS — G4733 Obstructive sleep apnea (adult) (pediatric): Secondary | ICD-10-CM | POA: Diagnosis not present

## 2024-01-14 DIAGNOSIS — Z1211 Encounter for screening for malignant neoplasm of colon: Secondary | ICD-10-CM | POA: Diagnosis not present

## 2024-01-31 DIAGNOSIS — L820 Inflamed seborrheic keratosis: Secondary | ICD-10-CM | POA: Diagnosis not present

## 2024-03-15 DIAGNOSIS — L820 Inflamed seborrheic keratosis: Secondary | ICD-10-CM | POA: Diagnosis not present
